# Patient Record
Sex: Female | Born: 2005
Health system: Southern US, Community
[De-identification: ages and names within clinical notes are randomized; demographics above are authoritative.]

## PROBLEM LIST (undated history)

## (undated) HISTORY — PX: TYMPANOSTOMY TUBE PLACEMENT: SHX32

---
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
## 2023-03-23 ENCOUNTER — Telehealth: Payer: No Typology Code available for payment source | Admitting: Family

## 2023-03-23 ENCOUNTER — Encounter: Payer: Self-pay | Admitting: Family

## 2023-03-23 DIAGNOSIS — R7989 Other specified abnormal findings of blood chemistry: Secondary | ICD-10-CM | POA: Diagnosis not present

## 2023-03-23 DIAGNOSIS — N926 Irregular menstruation, unspecified: Secondary | ICD-10-CM

## 2023-03-23 NOTE — Progress Notes (Signed)
THIS RECORD MAY CONTAIN CONFIDENTIAL INFORMATION THAT SHOULD NOT BE RELEASED WITHOUT REVIEW OF THE SERVICE PROVIDER.  Virtual Follow-Up Visit via Video Note  I connected with Danielle Mullen  on 03/23/23 at  9:00 AM EST by a video enabled telemedicine application and verified that I am speaking with the correct person using two identifiers.   Patient/parent location: dorm, [employer] Provider location: remote, [place]    I discussed the limitations of evaluation and management by telemedicine and the availability of in person appointments.  I discussed that the purpose of this telehealth visit is to provide medical care while limiting exposure to the novel coronavirus.  The patient expressed understanding and agreed to proceed.   Danielle Mullen is a 17 y.o. female referred by Maeola Harman, MD here today for follow-up of irregular menstrual cycle and low serum testosterone labs managed by OCPs. Danielle Mullen was referred by Dr Vanessa [place] for ongoing management.    History was provided by the patient.  Supervising Physician: Dr. Theadore Nan   Plan from Last Visit with Dr Vanessa [place] 12/22/22   ASSESSMENT: Danielle Mullen is a 17 y.o. AA female referred for issues with menses and concern for low testosterone on serum labs from PCP    Irregular menses with low androgen level - Danielle Mullen is now taking Aviane OCP (levonorgestrel) as this is a pro-androgenic pill - Danielle Mullen is no longer having nausea or vomiting associated with her menses - Family agrees to referral to adolescent medicine to continue OCP management.   Chief Complaint: No concerns, pill name is different that Aviane   History of Present Illness:  -periods are regular while on the pills, her Rx is Sronyx (same as Aviane, different brand name - both are levonorgestrol-ethinyl estradiol 0.1-20 MG-MCG tablet daily).  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  [employer]; member of [employer]  -lives in dorm, gets along well with roommate (met in [place] program in 2022)  -likes to go home on weekends (Danielle Mullen is home) considers herself to be a homebody  -taking multivitamin Flinstones daily  -Danielle Mullen does not have hx of migraines with aura   Allergies  Allergen Reactions   Fish Allergy Anaphylaxis   Shellfish Allergy Anaphylaxis   Food     NUTS   Outpatient Medications Prior to Visit  Medication Sig Dispense Refill   Albuterol Sulfate (PROAIR RESPICLICK) 108 (90 Base) MCG/ACT AEPB      cetirizine HCl (ZYRTEC) 5 MG/5ML SYRP Take by mouth daily.     EPINEPHrine (EPIPEN JR) 0.15 MG/0.3ML injection See admin instructions. (Patient not taking: Reported on 05/26/2021)     levonorgestrel-ethinyl estradiol (AVIANE) 0.1-20 MG-MCG tablet Take 1 tablet by mouth daily. 84 tablet 4   naproxen (NAPROSYN) 500 MG tablet Take 500 mg by mouth 2 (two) times daily.     Pediatric Multivitamins-Iron (FLINTSTONES COMPLETE) 10 MG CHEW 1 chewable     No facility-administered medications prior to visit.     Patient Active Problem List   Diagnosis Date Noted   Low serum testosterone level in female 01/22/2021   Irregular menstrual cycle 01/22/2021    The following portions of the patient's history were reviewed and updated as appropriate: allergies, current medications, past family history, past medical history, past social history, past surgical history, and problem list.  Visual Observations/Objective:   General Appearance: Well nourished well developed, in no apparent distress.  Eyes: conjunctiva  no swelling or erythema ENT/Mouth: No hoarseness, No cough for duration of visit.  Neck: Supple  Respiratory: Respiratory effort normal, normal rate, no retractions or distress.   Cardio: Appears well-perfused, noncyanotic Musculoskeletal: no obvious deformity Skin: visible skin without rashes, ecchymosis, erythema Neuro: Awake and oriented X 3,  Psych:  normal affect,  Insight and Judgment appropriate.    Assessment/Plan: 1. Irregular menstrual cycle 2. Low serum testosterone level in female  Stable on current regimen of 2nd generation COC for period regulation and low-testosterone level; reviewed return precautions; return as needed.   I discussed the assessment and treatment plan with the patient and/or parent/guardian.  They were provided an opportunity to ask questions and all were answered.  They agreed with the plan and demonstrated an understanding of the instructions. They were advised to call back or seek an in-person evaluation in the emergency room if the symptoms worsen or if the condition fails to improve as anticipated.   Follow-up:   PRN, otherwise 6 months    Danielle Mouse, NP    CC: Maeola Harman, MD, Maeola Harman, MD

## 2023-06-01 ENCOUNTER — Encounter: Payer: Self-pay | Admitting: Family

## 2023-06-01 ENCOUNTER — Other Ambulatory Visit: Payer: Self-pay | Admitting: Family

## 2023-06-01 MED ORDER — NAPROXEN 500 MG PO TABS: 500.0000 mg | ORAL_TABLET | Freq: Two times a day (BID) | ORAL | 2 refills | Status: AC | PRN

## 2023-07-15 ENCOUNTER — Encounter: Payer: Self-pay | Admitting: Family

## 2023-09-12 ENCOUNTER — Telehealth: Payer: Self-pay

## 2023-09-15 ENCOUNTER — Ambulatory Visit: Admitting: Family

## 2023-09-15 ENCOUNTER — Encounter: Payer: Self-pay | Admitting: Family

## 2023-09-15 ENCOUNTER — Other Ambulatory Visit: Payer: Self-pay | Admitting: Family

## 2023-09-15 VITALS — BP 121/74 | HR 85 | Ht 65.45 in | Wt 166.6 lb

## 2023-09-15 DIAGNOSIS — Z3202 Encounter for pregnancy test, result negative: Secondary | ICD-10-CM

## 2023-09-15 DIAGNOSIS — J45990 Exercise induced bronchospasm: Secondary | ICD-10-CM | POA: Diagnosis not present

## 2023-09-15 DIAGNOSIS — Z3041 Encounter for surveillance of contraceptive pills: Secondary | ICD-10-CM

## 2023-09-15 DIAGNOSIS — J302 Other seasonal allergic rhinitis: Secondary | ICD-10-CM | POA: Diagnosis not present

## 2023-09-15 DIAGNOSIS — Z113 Encounter for screening for infections with a predominantly sexual mode of transmission: Secondary | ICD-10-CM

## 2023-09-15 DIAGNOSIS — Z0000 Encounter for general adult medical examination without abnormal findings: Secondary | ICD-10-CM

## 2023-09-15 LAB — POCT URINE PREGNANCY: Preg Test, Ur: NEGATIVE

## 2023-09-15 MED ORDER — EPINEPHRINE 0.3 MG/0.3ML IJ SOAJ: 0.3000 mg | INTRAMUSCULAR | 1 refills | Status: AC | PRN

## 2023-09-15 MED ORDER — ALBUTEROL SULFATE 108 (90 BASE) MCG/ACT IN AEPB: INHALATION_SPRAY | RESPIRATORY_TRACT | 0 refills | Status: DC

## 2023-09-15 MED ORDER — LEVONORGESTREL-ETHINYL ESTRAD 0.1-20 MG-MCG PO TABS: 1.0000 | ORAL_TABLET | Freq: Every day | ORAL | 11 refills | Status: AC

## 2023-09-15 MED ORDER — AEROCHAMBER MV MISC: 0 refills | Status: AC

## 2023-09-15 MED ORDER — CETIRIZINE HCL 10 MG PO TABS: 10.0000 mg | ORAL_TABLET | Freq: Every day | ORAL | 0 refills | Status: AC

## 2023-09-15 NOTE — Progress Notes (Signed)
 History was provided by the patient.  Danielle Mullen is a 18 y.o. female who is here for well check.   PCP confirmed? Requested PCP here until she can establish with Adult PCP   Plan from last visit 03/23/23 1. Irregular menstrual cycle 2. Low serum testosterone level in female   Stable on current regimen of 2nd generation COC for period regulation and low-testosterone level; reviewed return precautions; return as needed.     Follow-up:   PRN, otherwise 6 months   Pertinent Labs: gc/c due today    HPI:   -aged out of Dr Aviva Boer; needs PE today and form completed for summer job  -just finished first year at [employer] (last presentation today at 10AM)  -works at [employer] after school; will transition to summer program  -vision check last was Feb 2025; new Rx for glasses at that time  -headaches: none  -vision changes: none, wearing glasses  -last dental appt was Jun 22, 2023; no dental concerns -LMP: supposed to start next Wednesday; taking pills daily  -no dysuria, no pelvic pain  -not sexually active  -no chest pain, SOB -no appetite changes, no stomach pains, no N/V/D/C  -no discharge changes -no muscle or joint pain -no rashes   Patient Active Problem List   Diagnosis Date Noted   Low serum testosterone level in female 01/22/2021   Irregular menstrual cycle 01/22/2021    Current Outpatient Medications on File Prior to Visit  Medication Sig Dispense Refill   Albuterol Sulfate (PROAIR RESPICLICK) 108 (90 Base) MCG/ACT AEPB      levonorgestrel -ethinyl estradiol  (SRONYX) 0.1-20 MG-MCG tablet Take 1 tablet by mouth daily.     naproxen  (NAPROSYN ) 500 MG tablet Take 1 tablet (500 mg total) by mouth 2 (two) times daily as needed. For cramping. Take with food. 60 tablet 2   Pediatric Multivitamins-Iron (FLINTSTONES COMPLETE) 10 MG CHEW 1 chewable     cetirizine HCl (ZYRTEC) 5 MG/5ML SYRP Take by mouth daily.     EPINEPHrine (EPIPEN JR) 0.15 MG/0.3ML injection See admin instructions.  (Patient not taking: Reported on 05/26/2021)     No current facility-administered medications on file prior to visit.    Allergies  Allergen Reactions   Fish Allergy Anaphylaxis   Shellfish Allergy Anaphylaxis   Food     NUTS    Physical Exam:    Vitals:   09/15/23 0927  BP: 121/74  Pulse: 85  Weight: 166 lb 9.6 oz (75.6 kg)  Height: 5' 5.45" (1.662 m)    Blood pressure %iles are not available for patients who are 18 years or older. No LMP recorded.  Physical Exam Vitals reviewed.  Constitutional:      General: She is not in acute distress.    Appearance: Normal appearance. She is well-developed.  HENT:     Head: Normocephalic and atraumatic.     Mouth/Throat:     Mouth: Mucous membranes are moist.  Eyes:     General: No scleral icterus.    Extraocular Movements: Extraocular movements intact.     Pupils: Pupils are equal, round, and reactive to light.  Neck:     Thyroid: No thyromegaly.  Cardiovascular:     Rate and Rhythm: Normal rate and regular rhythm.     Heart sounds: Normal heart sounds. No murmur heard. Pulmonary:     Effort: Pulmonary effort is normal.     Breath sounds: Normal breath sounds.  Abdominal:     General: Bowel sounds are normal. There is  no distension.     Palpations: Abdomen is soft.     Tenderness: There is no abdominal tenderness. There is no guarding.  Musculoskeletal:        General: Normal range of motion.     Cervical back: Normal range of motion and neck supple.  Lymphadenopathy:     Cervical: No cervical adenopathy.  Skin:    General: Skin is warm and dry.     Capillary Refill: Capillary refill takes less than 2 seconds.     Findings: No rash.  Neurological:     General: No focal deficit present.     Mental Status: She is alert and oriented to person, place, and time.     Cranial Nerves: No cranial nerve deficit.     Motor: Motor function is intact. No tremor.     Coordination: Coordination is intact.     Gait: Gait is  intact.  Psychiatric:        Attention and Perception: Attention normal.        Mood and Affect: Mood normal.        Speech: Speech normal.        Behavior: Behavior normal.        Thought Content: Thought content normal.        Judgment: Judgment normal.      Assessment/Plan: 1. Exercise-induced asthma (Primary) -refilled rescue inhaler + spacer; infrequent use; reviewed return precautions  2. Encounter for surveillance of contraceptive pills  -refill sent x one year; return precautions reviewed  2. Seasonal allergies -refilled cetirizine in pill form   -also refilled epi-pen Rx; has never required use   3. Annual wellness visit -stable, well-appearing; no concerns today; 18 yo female using birth control pills to regulate cycle; having monthly cycles with no cramping or breakthrough bleeding; refills sent for cetirizine for seasonal allergies, asthma rescue inhaler + spacer for sports-induced asthma; epi-pen refill although she has never required use; she has a negative PHQSADS screening today; form completed today for clearance of work.    4. Routine screening for STI (sexually transmitted infection) -annual screening  - C. trachomatis/N. gonorrhoeae RNA  5. Pregnancy examination or test, negative result -negative  - POCT urine pregnancy

## 2023-09-16 LAB — C. TRACHOMATIS/N. GONORRHOEAE RNA
C. trachomatis RNA, TMA: NOT DETECTED
N. gonorrhoeae RNA, TMA: NOT DETECTED

## 2023-12-12 NOTE — Telephone Encounter (Signed)
 Opened in error

## 2024-06-01 ENCOUNTER — Other Ambulatory Visit: Payer: Self-pay | Admitting: Family
